# Patient Record
Sex: Female | Born: 1970 | Hispanic: No | Marital: Married | Smoking: Never smoker
Health system: Southern US, Community
[De-identification: ages and names within clinical notes are randomized; demographics above are authoritative.]

## PROBLEM LIST (undated history)

## (undated) DIAGNOSIS — R079 Chest pain, unspecified: Secondary | ICD-10-CM

## (undated) HISTORY — PX: OTHER SURGICAL HISTORY: SHX169

## (undated) HISTORY — PX: ABDOMINAL HYSTERECTOMY: SHX81

---

## 2005-08-14 ENCOUNTER — Other Ambulatory Visit: Payer: Self-pay

## 2005-08-14 ENCOUNTER — Emergency Department: Payer: Self-pay | Admitting: Unknown Physician Specialty

## 2012-07-26 ENCOUNTER — Emergency Department: Payer: Self-pay | Admitting: Emergency Medicine

## 2013-01-09 ENCOUNTER — Encounter (HOSPITAL_COMMUNITY): Payer: Self-pay | Admitting: *Deleted

## 2013-01-09 ENCOUNTER — Inpatient Hospital Stay (HOSPITAL_COMMUNITY)
Admission: AD | Admit: 2013-01-09 | Discharge: 2013-01-10 | DRG: 140 | Disposition: A | Discharge status: Home / Self Care | Payer: BC Managed Care – PPO | Source: Other Acute Inpatient Hospital | Attending: Cardiovascular Disease | Admitting: Cardiovascular Disease

## 2013-01-09 DIAGNOSIS — R0789 Other chest pain: Secondary | ICD-10-CM | POA: Insufficient documentation

## 2013-01-09 DIAGNOSIS — Z8249 Family history of ischemic heart disease and other diseases of the circulatory system: Secondary | ICD-10-CM

## 2013-01-09 DIAGNOSIS — R079 Chest pain, unspecified: Secondary | ICD-10-CM

## 2013-01-09 DIAGNOSIS — I200 Unstable angina: Principal | ICD-10-CM | POA: Diagnosis present

## 2013-01-09 HISTORY — DX: Chest pain, unspecified: R07.9

## 2013-01-09 MED ORDER — ASPIRIN EC 81 MG PO TBEC
81.0000 mg | DELAYED_RELEASE_TABLET | Freq: Every day | ORAL | Status: DC
  Administered 2013-01-10: 81 mg via ORAL
  Filled 2013-01-09: qty 1

## 2013-01-09 MED ORDER — ONDANSETRON HCL 4 MG/2ML IJ SOLN: 4.0000 mg | Freq: Four times a day (QID) | INTRAMUSCULAR | Status: DC | PRN

## 2013-01-09 MED ORDER — ACETAMINOPHEN 325 MG PO TABS
650.0000 mg | ORAL_TABLET | ORAL | Status: DC | PRN
  Administered 2013-01-10: 12:00:00 650 mg via ORAL

## 2013-01-09 MED ORDER — NITROGLYCERIN 0.4 MG SL SUBL: 0.4000 mg | SUBLINGUAL_TABLET | SUBLINGUAL | Status: DC | PRN

## 2013-01-09 MED ORDER — SODIUM CHLORIDE 0.9 % IV SOLN
INTRAVENOUS | Status: AC
  Administered 2013-01-10: via INTRAVENOUS

## 2013-01-09 MED ORDER — ENOXAPARIN SODIUM 40 MG/0.4ML ~~LOC~~ SOLN
40.0000 mg | SUBCUTANEOUS | Status: DC
  Filled 2013-01-09: qty 0.4

## 2013-01-09 NOTE — H&P (Signed)
Cardiology History and Physical  WHITT,DONNA, MD  History of Present Illness (and review of medical records): Caroline Baker is a 42 y.o. female who presents for evaluation of chest pain.  She has no history of prior MI or known CAD.  She has no medical problems.  She denies tobacco use.  She does have family of premature CAD, mother with MI at 67 and brother with MI at 33. She was awakened by chest pain around 5am yesterday morning.  Pain was 5-6 and felt like stabbing sensation.  Pain lasted around 30 minutues prior to easing off enough for her to go back to sleep.  Woke up around 7am with dull ache in chest and stabbing pain returned around 8am.  Pain radiated to back and neck.  This continued intermittently throughout the day at home.  Patient works at home.  Pain would last 1-24minutes.  She took a Prilosec for what she thought may have been indigestion with no relief.  She presented to ED at [facility] as symptoms continued and then became associated with trouble breathing. She was given ASA, and treatment dose Lovenox. She had no acute changes on ecg and negative initial biomarkers. She was subsequently transferred for further management.  She has baseline chest discomfort 2/10 at this time.  Previous diagnostic testing for coronary artery disease includes: none. Previous history of cardiac disease includes None. Coronary artery disease risk factors include: family history of premature cardiovascular disease. Patient denies history of angina, CHF, coronary artery disease, previous M.I. and valvular disease.  Review of Systems She denied any fevers, chills, nights sweats.  No bleeding. No sick contacts.  No pleuritic symptoms. She did have loose stools twice today which was new.  She reported right lower extremity swelling a few days prior.  No trauma to leg. Further review of systems was otherwise negative other than stated in HPI.  There are no active problems to display for this  patient.  Past Medical History  Diagnosis Date  . Medical history non-contributory     Past Surgical History  Procedure Laterality Date  . Abdominal hysterectomy    . Left jaw surgery 2003      No prescriptions prior to admission   No Known Allergies  History  Substance Use Topics  . Smoking status: Never Smoker   . Smokeless tobacco: Not on file  . Alcohol Use: No    Family History  Problem Relation Age of Onset  . Coronary artery disease Mother   . Hypertension Mother   . Diabetes Mother   . Coronary artery disease Brother      Objective:  Patient Vitals for the past 8 hrs:  BP Temp Temp src Pulse Resp SpO2 Height Weight  01/10/13 0100 113/66 mmHg - - 70 18 100 % - -  01/10/13 0000 116/54 mmHg - - 61 20 100 % - -  01/09/13 2220 118/63 mmHg 98.2 F (36.8 C) Oral 83 20 100 % 5\' 3"  (1.6 m) 62.8 kg (138 lb 7.2 oz)   General appearance: alert, cooperative, appears stated age and no distress Head: Normocephalic, without obvious abnormality, atraumatic Eyes: conjunctivae/corneas clear. PERRL, EOM's intact. Fundi benign. Neck: no carotid bruit, no JVD and supple, symmetrical, trachea midline Lungs: clear to auscultation bilaterally Chest wall: no tenderness Heart: regular rate and rhythm, S1, S2 normal, no murmur, click, rub or gallop Abdomen: soft, non-tender; bowel sounds normal; no masses,  no organomegaly Extremities: extremities normal, symmetric, nontender, no erythema, atraumatic, no cyanosis or  edema Pulses: 2+ and symmetric Neurologic: Grossly normal  Results for orders placed during the hospital encounter of 01/09/13 (from the past 48 hour(s))  MRSA PCR SCREENING     Status: None   Collection Time    01/09/13 10:52 PM      Result Value Range   MRSA by PCR NEGATIVE  NEGATIVE   Comment:            The GeneXpert MRSA Assay (FDA     approved for NASAL specimens     only), is one component of a     comprehensive MRSA colonization     surveillance program. It  is not     intended to diagnose MRSA     infection nor to guide or     monitor treatment for     MRSA infections.  TROPONIN I     Status: None   Collection Time    01/10/13 12:09 AM      Result Value Range   Troponin I <0.30  <0.30 ng/mL   Comment:            Due to the release kinetics of cTnI,     a negative result within the first hours     of the onset of symptoms does not rule out     myocardial infarction with certainty.     If myocardial infarction is still suspected,     repeat the test at appropriate intervals.   No results found.  ECG:  Sinus rhythm, no ischemic changes, none prior to compare  Assessment: 8F with family history of premature CAD presents with new onset chest pain at rest.  ECG has no acute changes and initial troponin is negative.  Differential includes unstable angina due to underlying CAD, coronary vasospasm, or less likely PE.  Plan: 1. Cardiology Admission  2. Continuous monitoring on Telemetry. 3. Repeat ekg on admit, prn chest pain or arrythmia 4. Trend cardiac biomarkers, check lipids, hgba1c, tsh 5. Medical management to include ASA, NTG prn 6. Will keep NPO for further ischemic evaluation.

## 2013-01-10 ENCOUNTER — Inpatient Hospital Stay (HOSPITAL_COMMUNITY): Payer: BC Managed Care – PPO

## 2013-01-10 ENCOUNTER — Other Ambulatory Visit: Payer: Self-pay

## 2013-01-10 ENCOUNTER — Encounter (HOSPITAL_COMMUNITY): Payer: Self-pay | Admitting: Anesthesiology

## 2013-01-10 DIAGNOSIS — I200 Unstable angina: Principal | ICD-10-CM

## 2013-01-10 DIAGNOSIS — R079 Chest pain, unspecified: Secondary | ICD-10-CM

## 2013-01-10 DIAGNOSIS — R0789 Other chest pain: Secondary | ICD-10-CM | POA: Insufficient documentation

## 2013-01-10 LAB — CBC
Hemoglobin: 13.8 g/dL (ref 12.0–15.0)
MCH: 31.5 pg (ref 26.0–34.0)
MCHC: 34.5 g/dL (ref 30.0–36.0)
MCV: 91.3 fL (ref 78.0–100.0)
RBC: 4.38 MIL/uL (ref 3.87–5.11)

## 2013-01-10 LAB — LIPID PANEL
Cholesterol: 155 mg/dL (ref 0–200)
Total CHOL/HDL Ratio: 4.7 RATIO
Triglycerides: 131 mg/dL (ref <150)
VLDL: 26 mg/dL (ref 0–40)

## 2013-01-10 LAB — BASIC METABOLIC PANEL
BUN: 14 mg/dL (ref 6–23)
CO2: 23 mEq/L (ref 19–32)
Calcium: 8.6 mg/dL (ref 8.4–10.5)
Creatinine, Ser: 0.71 mg/dL (ref 0.50–1.10)
Glucose, Bld: 89 mg/dL (ref 70–99)

## 2013-01-10 LAB — TROPONIN I
Troponin I: <0.3 ng/mL (ref <0.30)
Troponin I: <0.3 ng/mL (ref <0.30)

## 2013-01-10 LAB — MRSA PCR SCREENING: MRSA by PCR: NEGATIVE

## 2013-01-10 MED ORDER — TECHNETIUM TC 99M SESTAMIBI GENERIC - CARDIOLITE
30.0000 | Freq: Once | INTRAVENOUS | Status: AC | PRN
  Administered 2013-01-10: 30 via INTRAVENOUS

## 2013-01-10 MED ORDER — ACTIVE PARTNERSHIP FOR HEALTH OF YOUR HEART BOOK
Freq: Once | Status: AC
  Administered 2013-01-10: 02:00:00
  Filled 2013-01-10: qty 1

## 2013-01-10 MED ORDER — ACETAMINOPHEN 325 MG PO TABS
ORAL_TABLET | ORAL | Status: AC
  Filled 2013-01-10: qty 2

## 2013-01-10 MED ORDER — TECHNETIUM TC 99M SESTAMIBI GENERIC - CARDIOLITE
10.0000 | Freq: Once | INTRAVENOUS | Status: AC | PRN
  Administered 2013-01-10: 10 via INTRAVENOUS

## 2013-01-10 NOTE — Discharge Summary (Signed)
Discharge Summary   Patient ID: Caroline Baker MRN: [id number], DOB/AGE: 1971/06/08 42 y.o. Admit date: 01/09/2013 D/C date:     01/10/2013  Primary Cardiologist: New to [place], seen by Dr. Shirlee Latch  Primary Discharge Diagnoses:  1. Chest pain, noncardiac  Hospital Course: Caroline Baker is a 42 y/o F with no prior history of CAD but a family history of such, and no significant medical history. She denied tobacco use. She presented to [facility] yesterday with complaints of chest pain. When it began yesterday morning, it was described as a stabbing sensation lasting about 30 minutes. It eased on its own allowing her to go back to sleep. She woke up around 7am with dull ache in chest and stabbing pain returned around 8am. This continued intermittently throughout the day at home. It would last 1-2 minutes. She took prilosec with no relief. She presented to the ER at [facility] as symptoms continued then became associated with trouble breathing. She was given ASA and treatment dose Lovenox. She had no acute changes on ecg and negative initial biomarkers. She was subsequently transferred to [facility] for further management as stress testing would not be available at [facility] until next week. CXR there was without acute CP disease. Troponins remained negative. Lipid panel returned an LDL of 96, HDL 33. CBC, A1C, and BMET were WNL here. She was not tachycardic or tachypnic. She underwent treadmill nuclear stress test which was normal with EF 68%. Her chest pain is felt to be noncardiac. She was instructed to f/u PCP for further eval if it recurs. Dr. Shirlee Latch has seen and examined the patient today and feels she is stable for discharge.  Discharge Vitals: Blood pressure 101/59, pulse 78, temperature 98.5 F (36.9 C), temperature source Oral, resp. rate 18, height 5\' 3"  (1.6 m), weight 138 lb 7.2 oz (62.8 kg), SpO2 97.00%.  Labs: Lab Results  Component Value Date   WBC 7.9 01/10/2013   HGB 13.8  01/10/2013   HCT 40.0 01/10/2013   MCV 91.3 01/10/2013   PLT 251 01/10/2013     Recent Labs Lab 01/10/13 0540  NA 140  K 4.0  CL 107  CO2 23  BUN 14  CREATININE 0.71  CALCIUM 8.6  GLUCOSE 89    Recent Labs  01/10/13 0009 01/10/13 0545  TROPONINI <0.30 <0.30   Lab Results  Component Value Date   CHOL 155 01/10/2013   HDL 33* 01/10/2013   LDLCALC 96 01/10/2013   TRIG 131 01/10/2013    Diagnostic Studies/Procedures   Nm Myocar Multi W/spect W/wall Motion / Ef 01/10/2013   Patient underwent treadmill myoview stress test under the supervision of [facility] cardiology staff.  Patient exercised 9 minutes and achieved her target heart rate. No chest pain. Mild dyspnea. EKG during exercise and during recovery showed no ischemic changes.  The quality of the images is satisfactory. Perfusion images show good perfusion throughout the myocardium. No evidence of ischemia or scar.  The EDV is 58ml. The ESV is 19 ml. The EF is 68%. There are no segmental wall motion abnormalities.  Impression: Normal treadmill myoview stress test. No ischemia. Normal LV function.  Thomas A. Brackbill MD   Original Report Authenticated By: Cassell Clement, M.D.   CXR at [facility] was reported without active disease  Discharge Medications     Medication List         SLEEP AID 25 MG tablet  Generic drug:  doxylamine (Sleep)  Take 25 mg by  mouth at bedtime as needed for sleep.        Disposition   The patient will be discharged in stable condition to home. Discharge Orders   Future Orders Complete By Expires     Diet - low sodium heart healthy  As directed     Increase activity slowly  As directed       Follow-up Information   Follow up with [facility], MD. (For further evaluation of your chest pain if it comes back)    Contact information:   [address] [facility] [place] [address] [phone]         Duration of Discharge Encounter: Greater than 30 minutes  including physician and PA time.  Signed, Ronie Spies PA-C 01/10/2013, 4:51 PM

## 2013-01-10 NOTE — Progress Notes (Addendum)
   Patient Name: Caroline Baker Date of Encounter: 01/10/2013   Active Problems:   Unstable angina   SUBJECTIVE  Denies any CP, nausea or SOB overnight.   CURRENT MEDS . aspirin EC  81 mg Oral Daily  . enoxaparin (LOVENOX) injection  40 mg Subcutaneous Q24H    OBJECTIVE  Filed Vitals:   01/10/13 0200 01/10/13 0416 01/10/13 0600 01/10/13 0756  BP: 92/62 93/57 94/60  96/47  Pulse: 60 68 59 58  Temp:   98.8 F (37.1 C) 98 F (36.7 C)  TempSrc:   Oral Oral  Resp: 18 18 18 18   Height:      Weight:      SpO2: 100% 100% 100% 100%    Intake/Output Summary (Last 24 hours) at 01/10/13 0800 Last data filed at 01/10/13 0700  Gross per 24 hour  Intake    525 ml  Output      0 ml  Net    525 ml   Filed Weights   01/09/13 2220  Weight: 138 lb 7.2 oz (62.8 kg)    PHYSICAL EXAM  General: Pleasant, NAD; lying in bed Neuro: Alert and oriented X 3. Moves all extremities spontaneously. Psych: Normal affect. HEENT:  Normal  Neck: Supple without bruits or JVD. Lungs:  Resp regular and unlabored, CTA. Heart: RRR no s3, s4, or murmurs. Abdomen: Soft, non-tender, non-distended, BS + x 4.  Extremities: No clubbing, cyanosis or edema. DP/PT/Radials 2+ and equal bilaterally.  Accessory Clinical Findings  CBC  Recent Labs  01/10/13 0540  WBC 7.9  HGB 13.8  HCT 40.0  MCV 91.3  PLT 251   Cardiac Enzymes  Recent Labs  01/10/13 0009 01/10/13 0545  TROPONINI <0.30 <0.30   TELE  SR 60's  ECG  Rsr, 64, no acute st/t changes.  Radiology/Studies  No results found.  ASSESSMENT AND PLAN  1) Unstable angina: 42 yo with no prior cardiac history who presented to ED 7/31 with brief episodes of somewhat atypical sharp/stabbing substernal CP that radiated to neck and back. First 2 CE's negative and now denies any CP. ECG NSR with no ischemic changes. Pending HgbA1C, lipids, and TSH. Can saline lock IV and stop fluids. With family history of CAD will order exercise stress  test today. Continue ASA.  Signed, Caroline Ducking NP  Patient seen with NP, agree with the above note.  Prolonged chest pain with negative enzymes and ECG.  Agree with ETT-myoview today given family history.   May go home if negative.   Marca Ancona 01/10/2013 8:07 AM

## 2013-01-13 NOTE — Progress Notes (Signed)
Utilization Review Completed Preslea Rhodus J. Riana Tessmer, RN, BSN, NCM [phone]  

## 2013-12-28 ENCOUNTER — Ambulatory Visit: Payer: Self-pay | Admitting: Physician Assistant

## 2014-11-02 ENCOUNTER — Other Ambulatory Visit: Payer: Self-pay | Admitting: Adult Health

## 2014-11-02 DIAGNOSIS — Z0000 Encounter for general adult medical examination without abnormal findings: Secondary | ICD-10-CM

## 2014-11-16 ENCOUNTER — Ambulatory Visit: Payer: Self-pay | Attending: Family Medicine

## 2018-10-24 ENCOUNTER — Other Ambulatory Visit: Payer: Self-pay

## 2018-11-06 ENCOUNTER — Other Ambulatory Visit: Payer: Self-pay | Admitting: Family Medicine

## 2018-11-06 DIAGNOSIS — M545 Low back pain, unspecified: Secondary | ICD-10-CM

## 2018-11-13 ENCOUNTER — Ambulatory Visit: Payer: Self-pay | Admitting: Urology

## 2019-03-11 ENCOUNTER — Other Ambulatory Visit: Payer: Self-pay | Admitting: Physician Assistant

## 2019-03-11 DIAGNOSIS — M5012 Mid-cervical disc disorder, unspecified level: Secondary | ICD-10-CM

## 2019-03-22 ENCOUNTER — Other Ambulatory Visit: Payer: Self-pay

## 2019-03-22 ENCOUNTER — Ambulatory Visit
Admission: RE | Admit: 2019-03-22 | Discharge: 2019-03-22 | Disposition: A | Discharge status: Home / Self Care | Payer: BC Managed Care – PPO | Source: Ambulatory Visit | Attending: Physician Assistant | Admitting: Physician Assistant

## 2019-03-22 DIAGNOSIS — M5012 Mid-cervical disc disorder, unspecified level: Secondary | ICD-10-CM | POA: Diagnosis not present

## 2019-11-04 ENCOUNTER — Other Ambulatory Visit: Payer: Self-pay | Admitting: Family Medicine

## 2019-11-04 DIAGNOSIS — R1310 Dysphagia, unspecified: Secondary | ICD-10-CM

## 2019-11-05 ENCOUNTER — Telehealth: Payer: Self-pay | Admitting: Family Medicine

## 2019-11-05 NOTE — Telephone Encounter (Signed)
11/05/19~LM on VM. MF

## 2019-11-13 ENCOUNTER — Ambulatory Visit: Payer: BC Managed Care – PPO

## 2019-11-18 ENCOUNTER — Other Ambulatory Visit: Payer: Self-pay

## 2019-11-18 ENCOUNTER — Ambulatory Visit
Admission: RE | Admit: 2019-11-18 | Discharge: 2019-11-18 | Disposition: A | Discharge status: Home / Self Care | Payer: BC Managed Care – PPO | Source: Ambulatory Visit | Attending: Family Medicine | Admitting: Family Medicine

## 2019-11-18 ENCOUNTER — Other Ambulatory Visit: Payer: Self-pay | Admitting: Family Medicine

## 2019-11-18 DIAGNOSIS — R1310 Dysphagia, unspecified: Secondary | ICD-10-CM

## 2021-04-01 ENCOUNTER — Encounter: Payer: Self-pay | Admitting: Internal Medicine

## 2022-03-27 ENCOUNTER — Other Ambulatory Visit: Payer: Self-pay | Admitting: Family Medicine

## 2022-03-28 ENCOUNTER — Ambulatory Visit: Payer: BC Managed Care – PPO | Attending: Family Medicine

## 2022-03-28 IMAGING — RF DG ESOPHAGUS
7 series · 14 of 24 positions shown · non-contrast
Comparison: None.

CLINICAL DATA: Difficulty swallowing solids

EXAM:
ESOPHOGRAM / BARIUM SWALLOW / BARIUM TABLET STUDY
TECHNIQUE: Combined double contrast and single contrast examination performed
using effervescent crystals, thick barium liquid, and thin barium
liquid. The patient was observed with fluoroscopy swallowing a 13 mm
barium sulphate tablet.
FLUOROSCOPY TIME:  Fluoroscopy Time:  2 minutes
Radiation Exposure Index (if provided by the fluoroscopic device):
12.4 mGy
Number of Acquired Spot Images: Multiple cine fluoroscopic runs

[Series 1: cp_standard · 0.17mm/px · 2 of 94 frames shown (1 of 5)]
[frame 15/94]
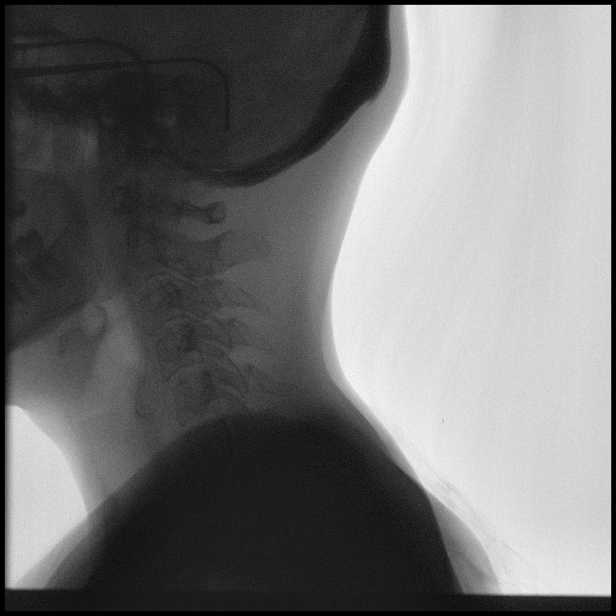
[frame 80/94]
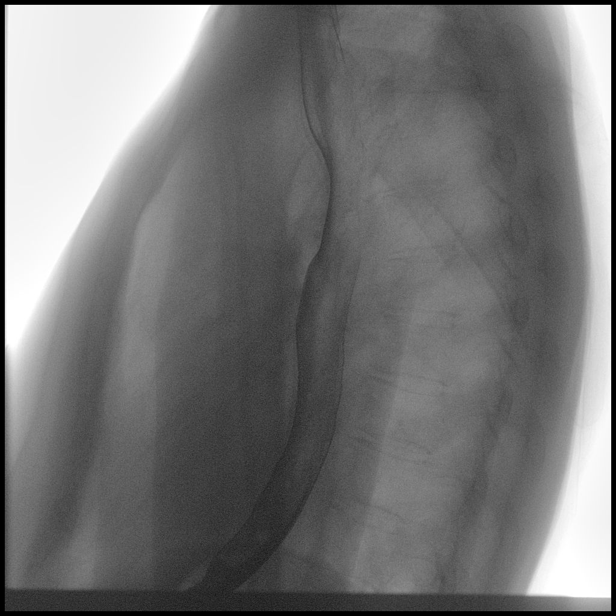

[Series 2: cp_standard · 0.17mm/px · 1 of 71 frames shown (2 of 5)]
[frame 36/71]
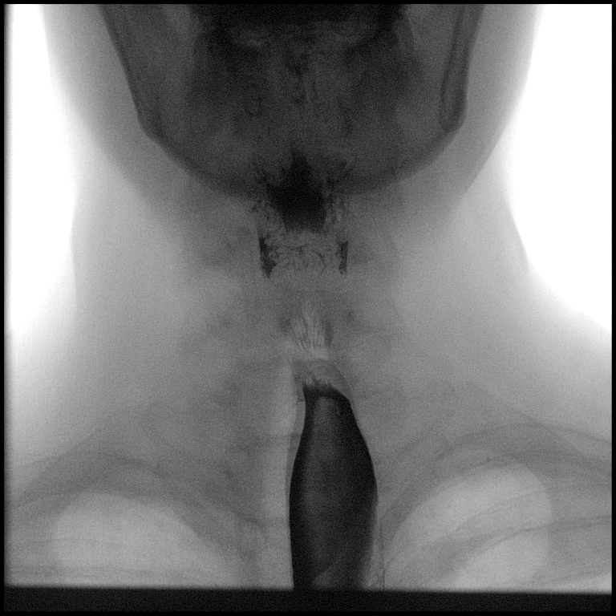

[Series 3: fluoro_barium 2fps_bw · 0.18mm/px · 3 of 5 frames shown (1 of 2)]
[frame 1/5]
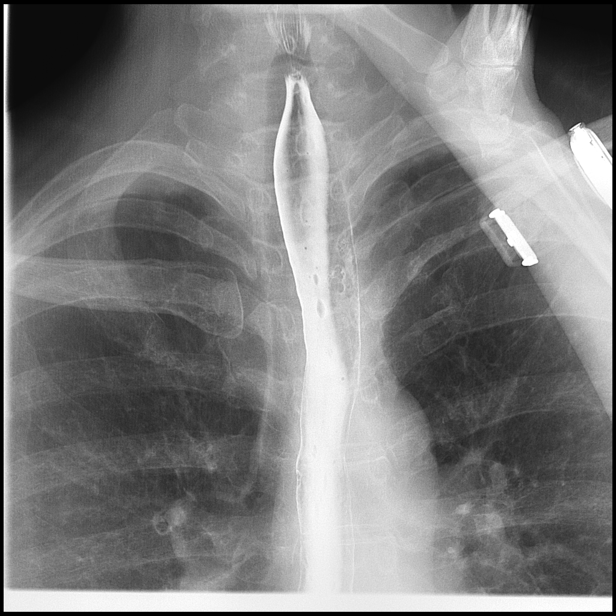
[frame 3/5]
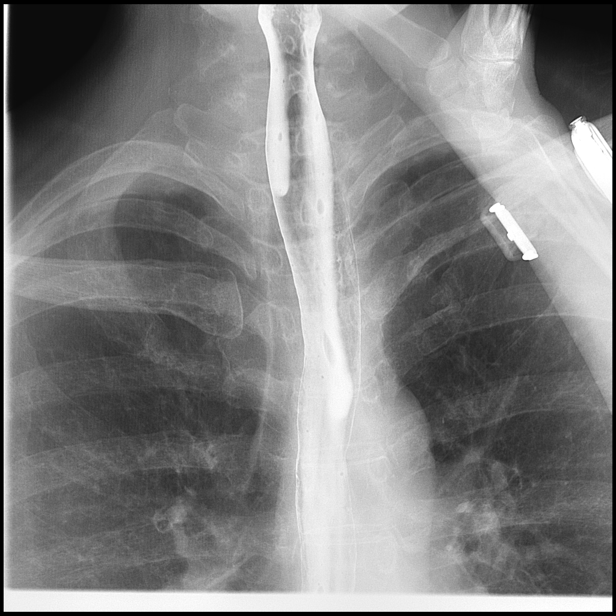
[frame 5/5]
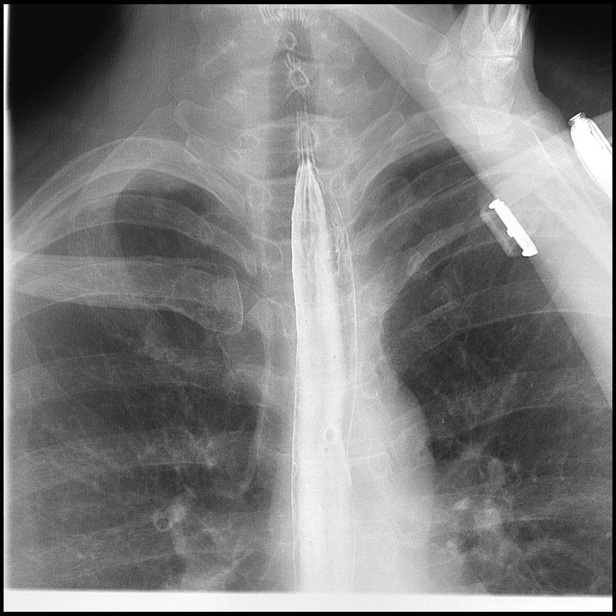

[Series 4: fluoro_barium 2fps_bw · 0.18mm/px · 2 of 7 frames shown (2 of 2)]
[frame 4/7]
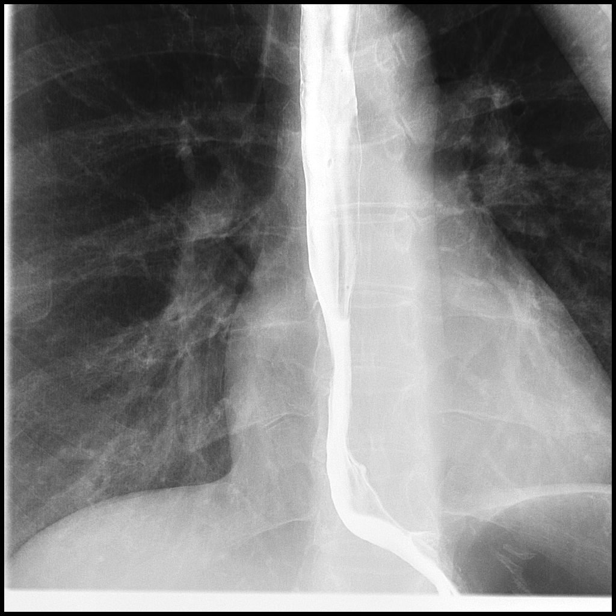
[frame 7/7]
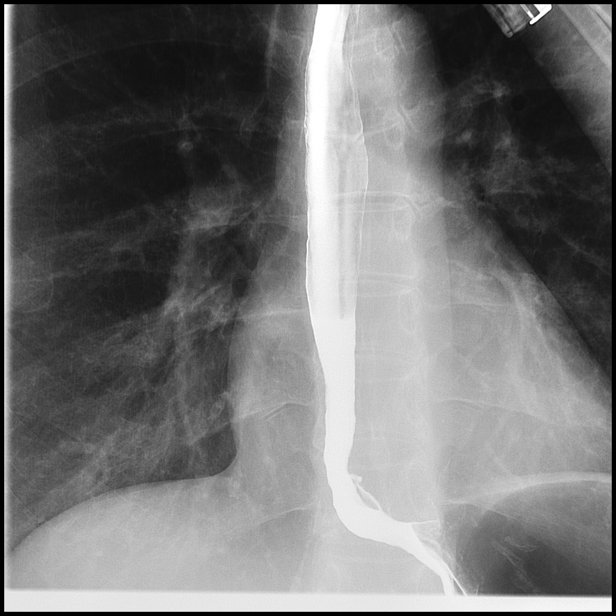

[Series 5: cp_standard · 0.18mm/px · 2 of 138 frames shown (3 of 5)]
[frame 22/138]
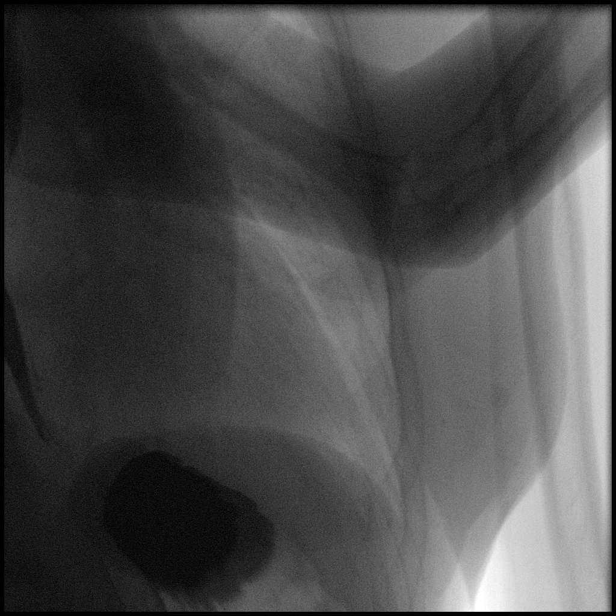
[frame 118/138]
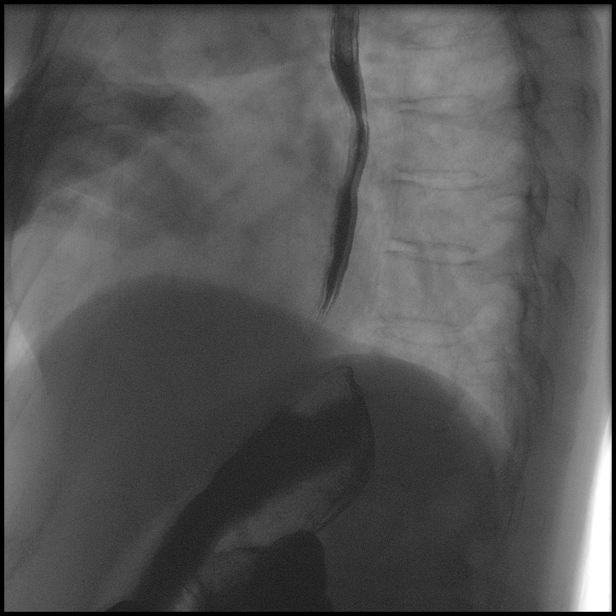

[Series 6: cp_standard · 0.18mm/px · 2 of 313 frames shown (4 of 5)]
[frame 126/313]
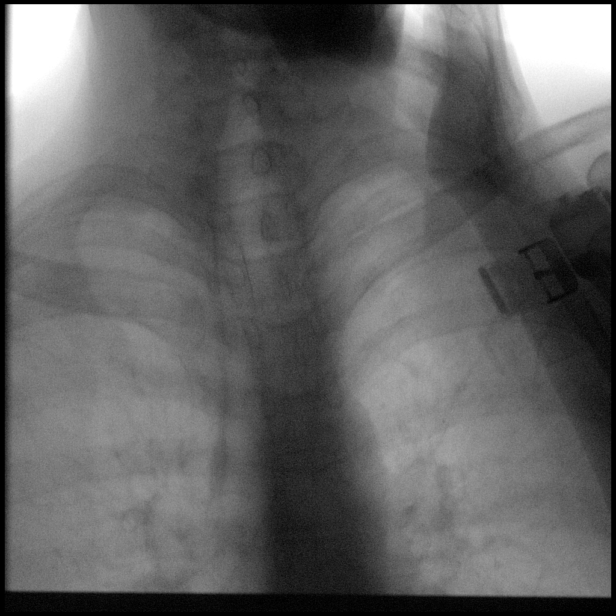
[frame 157/313]
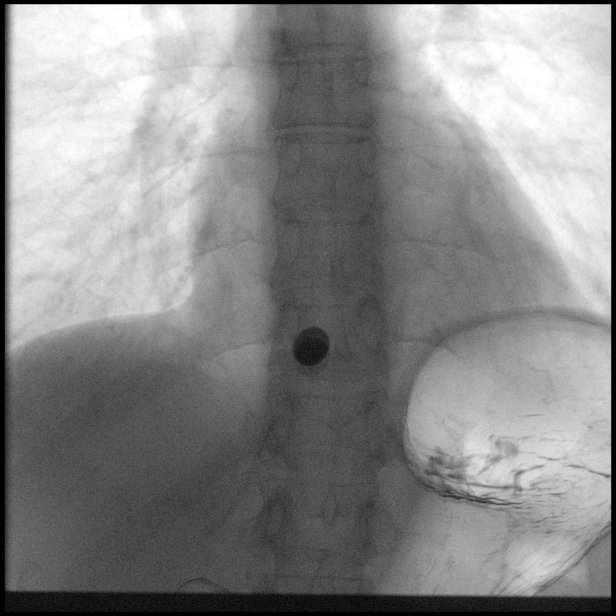

[Series 7: cp_standard · 0.18mm/px · 2 of 77 frames shown (5 of 5)]
[frame 39/77]
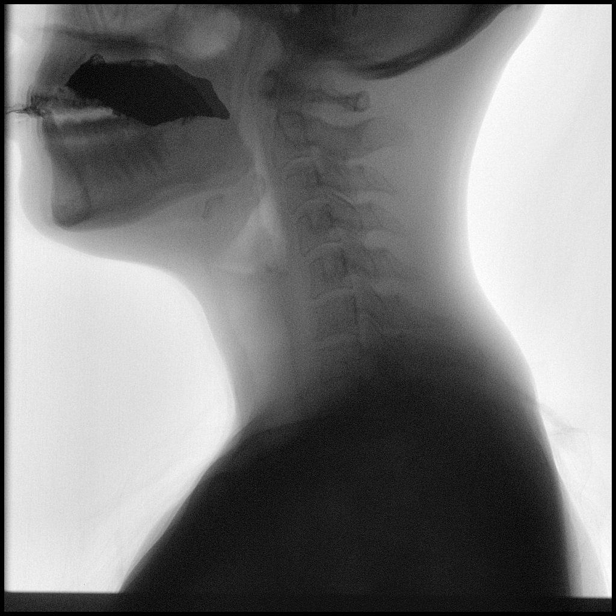
[frame 77/77]
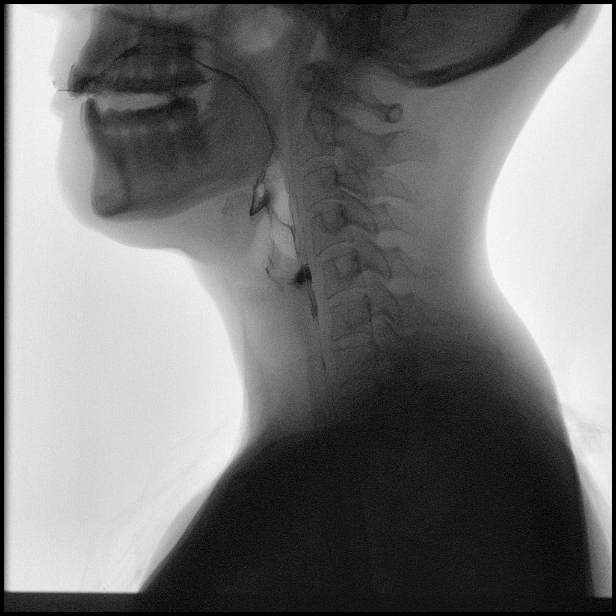

[14 of 24 positions shown; findings below may reference images not displayed]

FINDINGS: Swallowing mechanism and esophageal transit time are within normal
limits. No focal area of significant narrowing is noted. No mucosal
abnormality is seen. No reflux or reflux esophagitis is noted.
Barium impregnated tablet was administered and passed to the level
of the gastroesophageal junction and subsequently into the stomach
with slight delay at the GE junction which reproduced the patient's
symptomatology.
IMPRESSION: No reflux or reflux esophagitis.

Normal swallowing mechanism.

Slight delay of the barium tablet at the gastroesophageal junction
although no true underlying mechanical obstructive lesion is seen.

## 2022-04-04 ENCOUNTER — Other Ambulatory Visit: Payer: Self-pay | Admitting: Family Medicine

## 2022-04-04 DIAGNOSIS — Z1231 Encounter for screening mammogram for malignant neoplasm of breast: Secondary | ICD-10-CM

## 2023-03-27 ENCOUNTER — Other Ambulatory Visit: Payer: Self-pay | Admitting: *Deleted

## 2023-03-27 ENCOUNTER — Inpatient Hospital Stay
Admission: RE | Admit: 2023-03-27 | Discharge: 2023-03-27 | Disposition: A | Discharge status: Home / Self Care | Payer: Self-pay | Source: Ambulatory Visit | Attending: Family Medicine | Admitting: Family Medicine

## 2023-03-27 DIAGNOSIS — Z1231 Encounter for screening mammogram for malignant neoplasm of breast: Secondary | ICD-10-CM

## 2023-04-04 ENCOUNTER — Ambulatory Visit: Payer: BC Managed Care – PPO | Attending: Family Medicine
